# Patient Record
Sex: Female | Born: 1980
Health system: Southern US, Community
[De-identification: ages and names within clinical notes are randomized; demographics above are authoritative.]

## PROBLEM LIST (undated history)

## (undated) DIAGNOSIS — J301 Allergic rhinitis due to pollen: Secondary | ICD-10-CM

## (undated) HISTORY — DX: Allergic rhinitis due to pollen: J30.1

---
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
## 2002-12-13 HISTORY — PX: TUBAL LIGATION: SHX77

## 2004-12-30 ENCOUNTER — Ambulatory Visit (HOSPITAL_COMMUNITY): Admission: RE | Admit: 2004-12-30 | Discharge: 2004-12-30 | Payer: Self-pay | Admitting: Neurosurgery

## 2005-01-17 ENCOUNTER — Encounter: Payer: Self-pay | Admitting: Neurosurgery

## 2005-03-30 ENCOUNTER — Encounter: Payer: Self-pay | Admitting: Neurosurgery

## 2005-08-24 ENCOUNTER — Emergency Department: Payer: Self-pay | Admitting: Unknown Physician Specialty

## 2005-10-24 ENCOUNTER — Emergency Department (HOSPITAL_COMMUNITY): Admission: EM | Admit: 2005-10-24 | Discharge: 2005-10-24 | Payer: Self-pay | Admitting: Emergency Medicine

## 2005-10-30 ENCOUNTER — Emergency Department (HOSPITAL_COMMUNITY): Admission: EM | Admit: 2005-10-30 | Discharge: 2005-10-30 | Payer: Self-pay | Admitting: Emergency Medicine

## 2006-07-16 ENCOUNTER — Emergency Department: Payer: Self-pay | Admitting: Emergency Medicine

## 2008-08-10 ENCOUNTER — Emergency Department: Payer: Self-pay | Admitting: Emergency Medicine

## 2012-06-29 ENCOUNTER — Emergency Department: Payer: Self-pay | Admitting: Internal Medicine

## 2015-01-12 ENCOUNTER — Emergency Department: Payer: Self-pay | Admitting: Emergency Medicine

## 2015-09-27 LAB — HM PAP SMEAR: HM Pap smear: NORMAL

## 2018-03-13 ENCOUNTER — Ambulatory Visit: Payer: BLUE CROSS/BLUE SHIELD | Admitting: Family Medicine

## 2018-03-13 ENCOUNTER — Encounter: Payer: Self-pay | Admitting: Family Medicine

## 2018-03-13 VITALS — BP 108/72 | HR 91 | Temp 97.7°F | Resp 18 | Ht 63.25 in | Wt 160.3 lb

## 2018-03-13 DIAGNOSIS — J3089 Other allergic rhinitis: Secondary | ICD-10-CM | POA: Diagnosis not present

## 2018-03-13 DIAGNOSIS — Z91410 Personal history of adult physical and sexual abuse: Secondary | ICD-10-CM | POA: Insufficient documentation

## 2018-03-13 DIAGNOSIS — R05 Cough: Secondary | ICD-10-CM | POA: Diagnosis not present

## 2018-03-13 DIAGNOSIS — R059 Cough, unspecified: Secondary | ICD-10-CM

## 2018-03-13 HISTORY — DX: Personal history of adult physical and sexual abuse: Z91.410

## 2018-03-13 MED ORDER — FLUTICASONE PROPIONATE 50 MCG/ACT NA SUSP: 2.0000 | Freq: Every day | NASAL | 5 refills | Status: DC

## 2018-03-13 MED ORDER — LORATADINE 10 MG PO TABS: 10.0000 mg | ORAL_TABLET | Freq: Every day | ORAL | 5 refills | Status: DC
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
## 2018-03-14 ENCOUNTER — Encounter: Payer: Self-pay | Admitting: Family Medicine

## 2018-03-16 ENCOUNTER — Encounter: Payer: Self-pay | Admitting: Family Medicine

## 2018-03-18 ENCOUNTER — Encounter: Payer: Self-pay | Admitting: Family Medicine

## 2018-06-06 ENCOUNTER — Ambulatory Visit
Admission: RE | Admit: 2018-06-06 | Discharge: 2018-06-06 | Disposition: A | Discharge status: Home / Self Care | Payer: BLUE CROSS/BLUE SHIELD | Source: Ambulatory Visit | Attending: Family Medicine | Admitting: Family Medicine

## 2018-06-06 ENCOUNTER — Ambulatory Visit: Payer: BLUE CROSS/BLUE SHIELD | Admitting: Family Medicine

## 2018-06-06 ENCOUNTER — Encounter: Payer: Self-pay | Admitting: Family Medicine

## 2018-06-06 VITALS — BP 102/68 | HR 81 | Temp 98.4°F | Resp 18 | Ht 63.0 in | Wt 153.6 lb

## 2018-06-06 DIAGNOSIS — R058 Other specified cough: Secondary | ICD-10-CM

## 2018-06-06 DIAGNOSIS — R05 Cough: Secondary | ICD-10-CM | POA: Insufficient documentation

## 2018-06-06 DIAGNOSIS — Z87891 Personal history of nicotine dependence: Secondary | ICD-10-CM | POA: Diagnosis not present

## 2018-06-06 DIAGNOSIS — J3089 Other allergic rhinitis: Secondary | ICD-10-CM

## 2018-06-06 DIAGNOSIS — Z23 Encounter for immunization: Secondary | ICD-10-CM | POA: Diagnosis not present

## 2018-06-06 DIAGNOSIS — J4520 Mild intermittent asthma, uncomplicated: Secondary | ICD-10-CM

## 2018-06-06 MED ORDER — ALBUTEROL SULFATE HFA 108 (90 BASE) MCG/ACT IN AERS: 2.0000 | INHALATION_SPRAY | Freq: Four times a day (QID) | RESPIRATORY_TRACT | 0 refills | Status: DC | PRN

## 2018-06-06 MED ORDER — BENZONATATE 100 MG PO CAPS: 100.0000 mg | ORAL_CAPSULE | Freq: Two times a day (BID) | ORAL | 0 refills | Status: DC | PRN

## 2018-06-06 MED ORDER — MONTELUKAST SODIUM 10 MG PO TABS: 10.0000 mg | ORAL_TABLET | Freq: Every day | ORAL | 1 refills | Status: DC
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
## 2018-06-27 ENCOUNTER — Ambulatory Visit: Payer: BLUE CROSS/BLUE SHIELD | Admitting: Family Medicine

## 2018-07-14 ENCOUNTER — Encounter: Payer: Self-pay | Admitting: Family Medicine

## 2018-07-14 ENCOUNTER — Ambulatory Visit (INDEPENDENT_AMBULATORY_CARE_PROVIDER_SITE_OTHER): Payer: BLUE CROSS/BLUE SHIELD | Admitting: Family Medicine

## 2018-07-14 ENCOUNTER — Other Ambulatory Visit (HOSPITAL_COMMUNITY)
Admission: RE | Admit: 2018-07-14 | Discharge: 2018-07-14 | Disposition: A | Discharge status: Home / Self Care | Payer: BLUE CROSS/BLUE SHIELD | Source: Ambulatory Visit | Attending: Family Medicine | Admitting: Family Medicine

## 2018-07-14 VITALS — BP 116/68 | HR 79 | Temp 98.3°F | Resp 16 | Ht 63.0 in | Wt 154.1 lb

## 2018-07-14 DIAGNOSIS — Z1151 Encounter for screening for human papillomavirus (HPV): Secondary | ICD-10-CM | POA: Insufficient documentation

## 2018-07-14 DIAGNOSIS — Z01419 Encounter for gynecological examination (general) (routine) without abnormal findings: Secondary | ICD-10-CM | POA: Diagnosis not present

## 2018-07-14 DIAGNOSIS — Z1239 Encounter for other screening for malignant neoplasm of breast: Secondary | ICD-10-CM

## 2018-07-14 DIAGNOSIS — Z131 Encounter for screening for diabetes mellitus: Secondary | ICD-10-CM

## 2018-07-14 DIAGNOSIS — Z124 Encounter for screening for malignant neoplasm of cervix: Secondary | ICD-10-CM | POA: Insufficient documentation

## 2018-07-14 DIAGNOSIS — Z23 Encounter for immunization: Secondary | ICD-10-CM

## 2018-07-14 DIAGNOSIS — Z113 Encounter for screening for infections with a predominantly sexual mode of transmission: Secondary | ICD-10-CM

## 2018-07-14 DIAGNOSIS — R5383 Other fatigue: Secondary | ICD-10-CM

## 2018-07-14 DIAGNOSIS — Z1231 Encounter for screening mammogram for malignant neoplasm of breast: Secondary | ICD-10-CM

## 2018-07-14 NOTE — Patient Instructions (Signed)
Preventive Care 18-39 Years, Female Preventive care refers to lifestyle choices and visits with your health care provider that can promote health and wellness. What does preventive care include?  A yearly physical exam. This is also called an annual well check.  Dental exams once or twice a year.  Routine eye exams. Ask your health care provider how often you should have your eyes checked.  Personal lifestyle choices, including: ? Daily care of your teeth and gums. ? Regular physical activity. ? Eating a healthy diet. ? Avoiding tobacco and drug use. ? Limiting alcohol use. ? Practicing safe sex. ? Taking vitamin and mineral supplements as recommended by your health care provider. What happens during an annual well check? The services and screenings done by your health care provider during your annual well check will depend on your age, overall health, lifestyle risk factors, and family history of disease. Counseling Your health care provider may ask you questions about your:  Alcohol use.  Tobacco use.  Drug use.  Emotional well-being.  Home and relationship well-being.  Sexual activity.  Eating habits.  Work and work Statistician.  Method of birth control.  Menstrual cycle.  Pregnancy history.  Screening You may have the following tests or measurements:  Height, weight, and BMI.  Diabetes screening. This is done by checking your blood sugar (glucose) after you have not eaten for a while (fasting).  Blood pressure.  Lipid and cholesterol levels. These may be checked every 5 years starting at age 66.  Skin check.  Hepatitis C blood test.  Hepatitis B blood test.  Sexually transmitted disease (STD) testing.  BRCA-related cancer screening. This may be done if you have a family history of breast, ovarian, tubal, or peritoneal cancers.  Pelvic exam and Pap test. This may be done every 3 years starting at age 40. Starting at age 59, this may be done every 5  years if you have a Pap test in combination with an HPV test.  Discuss your test results, treatment options, and if necessary, the need for more tests with your health care provider. Vaccines Your health care provider may recommend certain vaccines, such as:  Influenza vaccine. This is recommended every year.  Tetanus, diphtheria, and acellular pertussis (Tdap, Td) vaccine. You may need a Td booster every 10 years.  Varicella vaccine. You may need this if you have not been vaccinated.  HPV vaccine. If you are 69 or younger, you may need three doses over 6 months.  Measles, mumps, and rubella (MMR) vaccine. You may need at least one dose of MMR. You may also need a second dose.  Pneumococcal 13-valent conjugate (PCV13) vaccine. You may need this if you have certain conditions and were not previously vaccinated.  Pneumococcal polysaccharide (PPSV23) vaccine. You may need one or two doses if you smoke cigarettes or if you have certain conditions.  Meningococcal vaccine. One dose is recommended if you are age 27-21 years and a first-year college student living in a residence hall, or if you have one of several medical conditions. You may also need additional booster doses.  Hepatitis A vaccine. You may need this if you have certain conditions or if you travel or work in places where you may be exposed to hepatitis A.  Hepatitis B vaccine. You may need this if you have certain conditions or if you travel or work in places where you may be exposed to hepatitis B.  Haemophilus influenzae type b (Hib) vaccine. You may need this if  you have certain risk factors.  Talk to your health care provider about which screenings and vaccines you need and how often you need them. This information is not intended to replace advice given to you by your health care provider. Make sure you discuss any questions you have with your health care provider. Document Released: 01/01/2002 Document Revised: 07/25/2016  Document Reviewed: 09/06/2015 Elsevier Interactive Patient Education  [employer].
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
## 2018-07-15 LAB — CBC WITH DIFFERENTIAL/PLATELET
Basophils Absolute: 22 {cells}/uL (ref 0–200)
Basophils Relative: 0.5 %
Eosinophils Absolute: 22 {cells}/uL (ref 15–500)
Eosinophils Relative: 0.5 %
HCT: 36.4 % (ref 35.0–45.0)
Hemoglobin: 12.5 g/dL (ref 11.7–15.5)
Lymphs Abs: 1402 {cells}/uL (ref 850–3900)
MCH: 31.4 pg (ref 27.0–33.0)
MCHC: 34.3 g/dL (ref 32.0–36.0)
MCV: 91.5 fL (ref 80.0–100.0)
MPV: 11 fL (ref 7.5–12.5)
Monocytes Relative: 7 %
Neutro Abs: 2554 {cells}/uL (ref 1500–7800)
Neutrophils Relative %: 59.4 %
Platelets: 199 Thousand/uL (ref 140–400)
RBC: 3.98 Million/uL (ref 3.80–5.10)
RDW: 11.9 % (ref 11.0–15.0)
Total Lymphocyte: 32.6 %
WBC mixed population: 301 {cells}/uL (ref 200–950)
WBC: 4.3 Thousand/uL (ref 3.8–10.8)

## 2018-07-15 LAB — COMPLETE METABOLIC PANEL WITHOUT GFR
AG Ratio: 1.7 (calc) (ref 1.0–2.5)
ALT: 8 U/L (ref 6–29)
AST: 13 U/L (ref 10–30)
Albumin: 4.3 g/dL (ref 3.6–5.1)
Alkaline phosphatase (APISO): 45 U/L (ref 33–115)
BUN: 17 mg/dL (ref 7–25)
CO2: 27 mmol/L (ref 20–32)
Calcium: 8.9 mg/dL (ref 8.6–10.2)
Chloride: 107 mmol/L (ref 98–110)
Creat: 0.97 mg/dL (ref 0.50–1.10)
GFR, Est African American: 85 mL/min/1.73m2
GFR, Est Non African American: 74 mL/min/1.73m2
Globulin: 2.6 g/dL (ref 1.9–3.7)
Glucose, Bld: 88 mg/dL (ref 65–99)
Potassium: 3.8 mmol/L (ref 3.5–5.3)
Sodium: 140 mmol/L (ref 135–146)
Total Bilirubin: 0.3 mg/dL (ref 0.2–1.2)
Total Protein: 6.9 g/dL (ref 6.1–8.1)

## 2018-07-15 LAB — LIPID PANEL
Cholesterol: 161 mg/dL
HDL: 77 mg/dL
LDL Cholesterol (Calc): 71 mg/dL
Non-HDL Cholesterol (Calc): 84 mg/dL
Total CHOL/HDL Ratio: 2.1 (calc)
Triglycerides: 44 mg/dL

## 2018-07-15 LAB — VITAMIN B12: Vitamin B-12: 372 pg/mL (ref 200–1100)

## 2018-07-15 LAB — HEPATITIS PANEL, ACUTE
HEP B C IGM: NONREACTIVE
HEP C AB: NONREACTIVE
Hep A IgM: NONREACTIVE
Hepatitis B Surface Ag: NONREACTIVE
SIGNAL TO CUT-OFF: 0.02 (ref <1.00)

## 2018-07-15 LAB — HEMOGLOBIN A1C
EAG (MMOL/L): 5.5 (calc)
Hgb A1c MFr Bld: 5.1 % of total Hgb (ref <5.7)
Mean Plasma Glucose: 100 (calc)

## 2018-07-15 LAB — HIV ANTIBODY (ROUTINE TESTING W REFLEX): HIV 1&2 Ab, 4th Generation: NONREACTIVE

## 2018-07-15 LAB — RPR: RPR Ser Ql: NONREACTIVE

## 2018-07-15 LAB — TSH: TSH: 1.04 m[IU]/L

## 2018-07-15 LAB — VITAMIN D 25 HYDROXY (VIT D DEFICIENCY, FRACTURES): Vit D, 25-Hydroxy: 23 ng/mL — ABNORMAL LOW (ref 30–100)

## 2018-07-16 LAB — CYTOLOGY - PAP
Chlamydia: NEGATIVE
DIAGNOSIS: NEGATIVE
HPV 16/18/45 GENOTYPING: NEGATIVE
HPV: DETECTED — AB
Neisseria Gonorrhea: NEGATIVE

## 2018-07-18 ENCOUNTER — Encounter: Payer: Self-pay | Admitting: Family Medicine

## 2018-07-18 DIAGNOSIS — R87810 Cervical high risk human papillomavirus (HPV) DNA test positive: Secondary | ICD-10-CM | POA: Insufficient documentation

## 2018-07-18 HISTORY — DX: Cervical high risk human papillomavirus (HPV) DNA test positive: R87.810

## 2018-09-12 ENCOUNTER — Ambulatory Visit: Payer: BLUE CROSS/BLUE SHIELD

## 2019-01-06 ENCOUNTER — Other Ambulatory Visit: Payer: Self-pay | Admitting: Family Medicine

## 2019-01-06 DIAGNOSIS — Z1231 Encounter for screening mammogram for malignant neoplasm of breast: Secondary | ICD-10-CM

## 2019-01-20 ENCOUNTER — Ambulatory Visit
Admission: RE | Admit: 2019-01-20 | Discharge: 2019-01-20 | Disposition: A | Discharge status: Home / Self Care | Payer: BLUE CROSS/BLUE SHIELD | Source: Ambulatory Visit | Attending: Family Medicine | Admitting: Family Medicine

## 2019-01-20 DIAGNOSIS — Z1231 Encounter for screening mammogram for malignant neoplasm of breast: Secondary | ICD-10-CM | POA: Diagnosis not present

## 2019-04-21 ENCOUNTER — Other Ambulatory Visit: Payer: Self-pay | Admitting: Family Medicine

## 2019-04-21 DIAGNOSIS — J3089 Other allergic rhinitis: Secondary | ICD-10-CM

## 2019-04-21 MED ORDER — LORATADINE 10 MG PO TABS: 10.0000 mg | ORAL_TABLET | Freq: Every day | ORAL | 2 refills | Status: DC

## 2019-04-21 NOTE — Telephone Encounter (Signed)
Copied from CRM [id number]. Topic: Quick Communication - Rx Refill/Question >> Apr 21, 2019  2:38 PM Gwenlyn Fudge A wrote: Medication: loratadine (CLARITIN) 10 MG tablet  Has the patient contacted their pharmacy? No. (Agent: If no, request that the patient contact the pharmacy for the refill.) (Agent: If yes, when and what did the pharmacy advise?)  Preferred Pharmacy (with phone number or street name): [facility] [address] [place] - [address] [place] [address] Phone: [phone] Fax: [phone] Not a 24 hour pharmacy; exact hours not known.    Agent: Please be advised that RX refills may take up to 3 business days. We ask that you follow-up with your pharmacy.

## 2019-07-09 ENCOUNTER — Other Ambulatory Visit: Payer: Self-pay

## 2019-07-09 ENCOUNTER — Encounter: Payer: Self-pay | Admitting: Nurse Practitioner

## 2019-07-09 ENCOUNTER — Ambulatory Visit (INDEPENDENT_AMBULATORY_CARE_PROVIDER_SITE_OTHER): Payer: BLUE CROSS/BLUE SHIELD | Admitting: Nurse Practitioner

## 2019-07-09 VITALS — Resp 16

## 2019-07-09 DIAGNOSIS — L02211 Cutaneous abscess of abdominal wall: Secondary | ICD-10-CM | POA: Diagnosis not present

## 2019-07-09 MED ORDER — SULFAMETHOXAZOLE-TRIMETHOPRIM 800-160 MG PO TABS: 1.0000 | ORAL_TABLET | Freq: Two times a day (BID) | ORAL | 0 refills | Status: DC
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
## 2019-07-09 NOTE — Patient Instructions (Addendum)
Please take your antibiotic as prescribed with food on your stomach to prevent nausea and upset stomach. Take the antibiotic for the entire course, even if your symptoms resolve before the course if completed. Using antibiotics inappropriately can make it harder to treat future infections. If you have any concerning side effects please stop the medication and let us know immediately. I do recommend taking a probiotic to replenish your good gut health anytime you take an antibiotic. You can get probiotics in types of yogurt like activia, or other food/drinks such as kimchi, kombucha , sauerkraut, or you can take an over the counter supplement.   Skin Abscess A skin abscess is an infected area on or under your skin that contains a collection of pus and other material. An abscess may also be called a furuncle, carbuncle, or boil. An abscess can occur in or on almost any part of your body. Some abscesses break open (rupture) on their own. Most continue to get worse unless they are treated. The infection can spread deeper into the body and eventually into your blood, which can make you feel ill. Treatment usually involves draining the abscess. What are the causes? An abscess occurs when germs, like bacteria, pass through your skin and cause an infection. This may be caused by:  A scrape or cut on your skin.  A puncture wound through your skin, including a needle injection or insect bite.  Blocked oil or sweat glands.  Blocked and infected hair follicles.  A cyst that forms beneath your skin (sebaceous cyst) and becomes infected. What increases the risk? This condition is more likely to develop in people who:  Have a weak body defense system (immune system).  Have diabetes.  Have dry and irritated skin.  Get frequent injections or use illegal IV drugs.  Have a foreign body in a wound, such as a splinter.  Have problems with their lymph system or veins. What are the signs or symptoms?  Symptoms of this condition include:  A painful, firm bump under the skin.  A bump with pus at the top. This may break through the skin and drain. Other symptoms include:  Redness surrounding the abscess site.  Warmth.  Swelling of the lymph nodes (glands) near the abscess.  Tenderness.  A sore on the skin. How is this diagnosed? This condition may be diagnosed based on:  A physical exam.  Your medical history.  A sample of pus. This may be used to find out what is causing the infection.  Blood tests.  Imaging tests, such as an ultrasound, CT scan, or MRI. How is this treated? A small abscess that drains on its own may not need treatment. Treatment for larger abscesses may include:  Moist heat or heat pack applied to the area several times a day.  A procedure to drain the abscess (incision and drainage).  Antibiotic medicines. For a severe abscess, you may first get antibiotics through an IV and then change to antibiotics by mouth. Follow these instructions at home: Medicines   Take over-the-counter and prescription medicines only as told by your health care provider.  If you were prescribed an antibiotic medicine, take it as told by your health care provider. Do not stop taking the antibiotic even if you start to feel better. Abscess care   If you have an abscess that has not drained, apply heat to the affected area. Use the heat source that your health care provider recommends, such as a moist heat pack or  a heating pad. ? Place a towel between your skin and the heat source. ? Leave the heat on for 20-30 minutes. ? Remove the heat if your skin turns bright red. This is especially important if you are unable to feel pain, heat, or cold. You may have a greater risk of getting burned.  Follow instructions from your health care provider about how to take care of your abscess. Make sure you: ? Cover the abscess with a bandage (dressing). ? Change your dressing or  gauze as told by your health care provider. ? Wash your hands with soap and water before you change the dressing or gauze. If soap and water are not available, use hand sanitizer.  Check your abscess every day for signs of a worsening infection. Check for: ? More redness, swelling, or pain. ? More fluid or blood. ? Warmth. ? More pus or a bad smell. General instructions  To avoid spreading the infection: ? Do not share personal care items, towels, or hot tubs with others. ? Avoid making skin contact with other people.  Keep all follow-up visits as told by your health care provider. This is important. Contact a health care provider if you have:  More redness, swelling, or pain around your abscess.  More fluid or blood coming from your abscess.  Warm skin around your abscess.  More pus or a bad smell coming from your abscess.  A fever.  Muscle aches.  Chills or a general ill feeling. Get help right away if you:  Have severe pain.  See red streaks on your skin spreading away from the abscess. Summary  A skin abscess is an infected area on or under your skin that contains a collection of pus and other material.  A small abscess that drains on its own may not need treatment.  Treatment for larger abscesses may include having a procedure to drain the abscess and taking an antibiotic. This information is not intended to replace advice given to you by your health care provider. Make sure you discuss any questions you have with your health care provider. Document Released: 08/15/2005 Document Revised: 02/26/2019 Document Reviewed: 12/19/2017 Elsevier Patient Education  2020 [employer].

## 2019-07-16 ENCOUNTER — Ambulatory Visit (INDEPENDENT_AMBULATORY_CARE_PROVIDER_SITE_OTHER): Payer: BLUE CROSS/BLUE SHIELD | Admitting: Family Medicine

## 2019-07-16 ENCOUNTER — Encounter: Payer: Self-pay | Admitting: Family Medicine

## 2019-07-16 ENCOUNTER — Other Ambulatory Visit: Payer: Self-pay

## 2019-07-16 ENCOUNTER — Other Ambulatory Visit (HOSPITAL_COMMUNITY)
Admission: RE | Admit: 2019-07-16 | Discharge: 2019-07-16 | Disposition: A | Discharge status: Home / Self Care | Payer: BLUE CROSS/BLUE SHIELD | Source: Ambulatory Visit | Attending: Family Medicine | Admitting: Family Medicine

## 2019-07-16 VITALS — BP 110/72 | HR 108 | Temp 96.6°F | Resp 16 | Ht 62.75 in | Wt 158.2 lb

## 2019-07-16 DIAGNOSIS — Z1239 Encounter for other screening for malignant neoplasm of breast: Secondary | ICD-10-CM | POA: Diagnosis not present

## 2019-07-16 DIAGNOSIS — Z124 Encounter for screening for malignant neoplasm of cervix: Secondary | ICD-10-CM

## 2019-07-16 DIAGNOSIS — Z01419 Encounter for gynecological examination (general) (routine) without abnormal findings: Secondary | ICD-10-CM | POA: Diagnosis not present

## 2019-07-16 NOTE — Patient Instructions (Signed)
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
## 2019-07-21 LAB — CYTOLOGY - PAP: Diagnosis: NEGATIVE

## 2020-01-22 ENCOUNTER — Ambulatory Visit
Admission: RE | Admit: 2020-01-22 | Discharge: 2020-01-22 | Disposition: A | Discharge status: Home / Self Care | Payer: BLUE CROSS/BLUE SHIELD | Source: Ambulatory Visit | Attending: Family Medicine | Admitting: Family Medicine

## 2020-01-22 DIAGNOSIS — Z1231 Encounter for screening mammogram for malignant neoplasm of breast: Secondary | ICD-10-CM | POA: Insufficient documentation

## 2020-01-22 DIAGNOSIS — Z1239 Encounter for other screening for malignant neoplasm of breast: Secondary | ICD-10-CM

## 2020-04-13 ENCOUNTER — Telehealth: Payer: Self-pay

## 2020-04-13 NOTE — Telephone Encounter (Signed)
Copied from CRM [id number]. Topic: General - Other >> Apr 13, 2020  9:00 AM Worthy Keeler D wrote: Reason for CRM: Patient ci requesting a copy of her tb test results  I don't see the results or an order in the system. Lab is checking on this as well.

## 2020-05-29 ENCOUNTER — Other Ambulatory Visit: Payer: Self-pay | Admitting: Family Medicine

## 2020-05-29 DIAGNOSIS — J3089 Other allergic rhinitis: Secondary | ICD-10-CM

## 2020-05-29 NOTE — Telephone Encounter (Signed)
Requested Prescriptions  Pending Prescriptions Disp Refills  . loratadine (CLARITIN) 10 MG tablet [Pharmacy Med Name: LORATADINE 10 MG TABLET] 90 tablet     Sig: TAKE 1 TABLET BY MOUTH EVERY DAY     Ear, Nose, and Throat:  Antihistamines Passed - 05/29/2020 12:49 AM      Passed - Valid encounter within last 12 months    Recent Outpatient Visits          10 months ago Well woman exam   [facility] Alba Cory, MD   10 months ago Abscess of abdominal wall   [facility] Cheryle Horsfall, NP   1 year ago Well woman exam   [facility] Alba Cory, MD   1 year ago Recurrent cough   [facility] Alba Cory, MD   2 years ago Environmental and seasonal allergies   [facility] Alba Cory, MD      Future Appointments            In 2 months Carlynn Purl, Danna Hefty, MD [facility]
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
## 2020-08-05 ENCOUNTER — Inpatient Hospital Stay (HOSPITAL_COMMUNITY): Admit: 2020-08-05 | Payer: BLUE CROSS/BLUE SHIELD

## 2020-08-05 ENCOUNTER — Encounter: Payer: Self-pay | Admitting: Family Medicine

## 2020-08-05 ENCOUNTER — Ambulatory Visit (INDEPENDENT_AMBULATORY_CARE_PROVIDER_SITE_OTHER): Payer: BLUE CROSS/BLUE SHIELD | Admitting: Family Medicine

## 2020-08-05 ENCOUNTER — Other Ambulatory Visit: Payer: Self-pay

## 2020-08-05 VITALS — BP 100/70 | HR 79 | Temp 98.1°F | Resp 16 | Ht 63.0 in | Wt 164.3 lb

## 2020-08-05 DIAGNOSIS — Z124 Encounter for screening for malignant neoplasm of cervix: Secondary | ICD-10-CM

## 2020-08-05 DIAGNOSIS — Z0000 Encounter for general adult medical examination without abnormal findings: Secondary | ICD-10-CM | POA: Diagnosis not present

## 2020-08-05 NOTE — Patient Instructions (Addendum)
Check coverage of HPV vaccine Please re-consider flu and COVID vaccine   Preventive Care 41-41 Years Old, Female Preventive care refers to visits with your health care provider and lifestyle choices that can promote health and wellness. This includes:  A yearly physical exam. This may also be called an annual well check.  Regular dental visits and eye exams.  Immunizations.  Screening for certain conditions.  Healthy lifestyle choices, such as eating a healthy diet, getting regular exercise, not using drugs or products that contain nicotine and tobacco, and limiting alcohol use. What can I expect for my preventive care visit? Physical exam Your health care provider will check your:  Height and weight. This may be used to calculate body mass index (BMI), which tells if you are at a healthy weight.  Heart rate and blood pressure.  Skin for abnormal spots. Counseling Your health care provider may ask you questions about your:  Alcohol, tobacco, and drug use.  Emotional well-being.  Home and relationship well-being.  Sexual activity.  Eating habits.  Work and work Statistician.  Method of birth control.  Menstrual cycle.  Pregnancy history. What immunizations do I need?  Influenza (flu) vaccine  This is recommended every year. Tetanus, diphtheria, and pertussis (Tdap) vaccine  You may need a Td booster every 10 years. Varicella (chickenpox) vaccine  You may need this if you have not been vaccinated. Zoster (shingles) vaccine  You may need this after age 55. Measles, mumps, and rubella (MMR) vaccine  You may need at least one dose of MMR if you were born in 1957 or later. You may also need a second dose. Pneumococcal conjugate (PCV13) vaccine  You may need this if you have certain conditions and were not previously vaccinated. Pneumococcal polysaccharide (PPSV23) vaccine  You may need one or two doses if you smoke cigarettes or if you have certain  conditions. Meningococcal conjugate (MenACWY) vaccine  You may need this if you have certain conditions. Hepatitis A vaccine  You may need this if you have certain conditions or if you travel or work in places where you may be exposed to hepatitis A. Hepatitis B vaccine  You may need this if you have certain conditions or if you travel or work in places where you may be exposed to hepatitis B. Haemophilus influenzae type b (Hib) vaccine  You may need this if you have certain conditions. Human papillomavirus (HPV) vaccine  If recommended by your health care provider, you may need three doses over 6 months. You may receive vaccines as individual doses or as more than one vaccine together in one shot (combination vaccines). Talk with your health care provider about the risks and benefits of combination vaccines. What tests do I need? Blood tests  Lipid and cholesterol levels. These may be checked every 5 years, or more frequently if you are over 10 years old.  Hepatitis C test.  Hepatitis B test. Screening  Lung cancer screening. You may have this screening every year starting at age 46 if you have a 30-pack-year history of smoking and currently smoke or have quit within the past 15 years.  Colorectal cancer screening. All adults should have this screening starting at age 82 and continuing until age 73. Your health care provider may recommend screening at age 35 if you are at increased risk. You will have tests every 1-10 years, depending on your results and the type of screening test.  Diabetes screening. This is done by checking your blood sugar (glucose)  after you have not eaten for a while (fasting). You may have this done every 1-3 years.  Mammogram. This may be done every 1-2 years. Talk with your health care provider about when you should start having regular mammograms. This may depend on whether you have a family history of breast cancer.  BRCA-related cancer screening. This  may be done if you have a family history of breast, ovarian, tubal, or peritoneal cancers.  Pelvic exam and Pap test. This may be done every 3 years starting at age 26. Starting at age 30, this may be done every 5 years if you have a Pap test in combination with an HPV test. Other tests  Sexually transmitted disease (STD) testing.  Bone density scan. This is done to screen for osteoporosis. You may have this scan if you are at high risk for osteoporosis. Follow these instructions at home: Eating and drinking  Eat a diet that includes fresh fruits and vegetables, whole grains, lean protein, and low-fat dairy.  Take vitamin and mineral supplements as recommended by your health care provider.  Do not drink alcohol if: ? Your health care provider tells you not to drink. ? You are pregnant, may be pregnant, or are planning to become pregnant.  If you drink alcohol: ? Limit how much you have to 0-1 drink a day. ? Be aware of how much alcohol is in your drink. In the [place] one drink equals one 12 oz bottle of beer (355 mL), one 5 oz glass of wine (148 mL), or one 1 oz glass of hard liquor (44 mL). Lifestyle  Take daily care of your teeth and gums.  Stay active. Exercise for at least 30 minutes on 5 or more days each week.  Do not use any products that contain nicotine or tobacco, such as cigarettes, e-cigarettes, and chewing tobacco. If you need help quitting, ask your health care provider.  If you are sexually active, practice safe sex. Use a condom or other form of birth control (contraception) in order to prevent pregnancy and STIs (sexually transmitted infections).  If told by your health care provider, take low-dose aspirin daily starting at age 35. What's next?  Visit your health care provider once a year for a well check visit.  Ask your health care provider how often you should have your eyes and teeth checked.  Stay up to date on all vaccines. This information is not  intended to replace advice given to you by your health care provider. Make sure you discuss any questions you have with your health care provider. Document Revised: 07/17/2018 Document Reviewed: 07/17/2018 Elsevier Patient Education  2020 [employer].

## 2020-08-06 LAB — COMPLETE METABOLIC PANEL WITH GFR
AG Ratio: 1.4 (calc) (ref 1.0–2.5)
ALT: 11 U/L (ref 6–29)
AST: 14 U/L (ref 10–30)
Albumin: 4.1 g/dL (ref 3.6–5.1)
Alkaline phosphatase (APISO): 53 U/L (ref 31–125)
BUN: 15 mg/dL (ref 7–25)
CO2: 23 mmol/L (ref 20–32)
Calcium: 8.7 mg/dL (ref 8.6–10.2)
Chloride: 107 mmol/L (ref 98–110)
Creat: 0.98 mg/dL (ref 0.50–1.10)
GFR, Est African American: 83 mL/min/{1.73_m2} (ref >=60)
GFR, Est Non African American: 72 mL/min/{1.73_m2} (ref >=60)
Globulin: 3 g/dL (calc) (ref 1.9–3.7)
Glucose, Bld: 85 mg/dL (ref 65–99)
Potassium: 4.3 mmol/L (ref 3.5–5.3)
Sodium: 140 mmol/L (ref 135–146)
Total Bilirubin: 0.5 mg/dL (ref 0.2–1.2)
Total Protein: 7.1 g/dL (ref 6.1–8.1)

## 2020-08-06 LAB — CBC WITH DIFFERENTIAL/PLATELET
Absolute Monocytes: 475 cells/uL (ref 200–950)
Basophils Absolute: 31 cells/uL (ref 0–200)
Basophils Relative: 0.7 %
Eosinophils Absolute: 79 cells/uL (ref 15–500)
Eosinophils Relative: 1.8 %
HCT: 39.7 % (ref 35.0–45.0)
Hemoglobin: 13.3 g/dL (ref 11.7–15.5)
Lymphs Abs: 1478 cells/uL (ref 850–3900)
MCH: 31.1 pg (ref 27.0–33.0)
MCHC: 33.5 g/dL (ref 32.0–36.0)
MCV: 92.8 fL (ref 80.0–100.0)
MPV: 10.8 fL (ref 7.5–12.5)
Monocytes Relative: 10.8 %
Neutro Abs: 2336 cells/uL (ref 1500–7800)
Neutrophils Relative %: 53.1 %
Platelets: 214 10*3/uL (ref 140–400)
RBC: 4.28 10*6/uL (ref 3.80–5.10)
RDW: 11.8 % (ref 11.0–15.0)
Total Lymphocyte: 33.6 %
WBC: 4.4 10*3/uL (ref 3.8–10.8)

## 2020-08-06 LAB — ADVANCED WRITTEN NOTIFICATION (AWN) TEST REFUSAL: AWN TEST REFUSED: 760016802

## 2020-08-08 LAB — CYTOLOGY - PAP
Chlamydia: NEGATIVE
Comment: NEGATIVE
Comment: NEGATIVE
Comment: NORMAL
Diagnosis: NEGATIVE
High risk HPV: NEGATIVE
Neisseria Gonorrhea: NEGATIVE

## 2020-12-16 ENCOUNTER — Other Ambulatory Visit: Payer: Self-pay | Admitting: Family Medicine

## 2020-12-16 DIAGNOSIS — Z1231 Encounter for screening mammogram for malignant neoplasm of breast: Secondary | ICD-10-CM

## 2021-01-27 ENCOUNTER — Other Ambulatory Visit: Payer: Self-pay

## 2021-01-27 ENCOUNTER — Ambulatory Visit
Admission: RE | Admit: 2021-01-27 | Discharge: 2021-01-27 | Disposition: A | Discharge status: Home / Self Care | Payer: BLUE CROSS/BLUE SHIELD | Source: Ambulatory Visit | Attending: Family Medicine | Admitting: Family Medicine

## 2021-01-27 DIAGNOSIS — Z1231 Encounter for screening mammogram for malignant neoplasm of breast: Secondary | ICD-10-CM | POA: Insufficient documentation

## 2021-08-11 ENCOUNTER — Encounter: Payer: BLUE CROSS/BLUE SHIELD | Admitting: Family Medicine

## 2021-08-11 ENCOUNTER — Other Ambulatory Visit: Payer: Self-pay

## 2021-08-11 NOTE — Patient Instructions (Signed)
Preventive Care 40-42 Years Old, Female Preventive care refers to lifestyle choices and visits with your health care provider that can promote health and wellness. This includes: A yearly physical exam. This is also called an annual wellness visit. Regular dental and eye exams. Immunizations. Screening for certain conditions. Healthy lifestyle choices, such as: Eating a healthy diet. Getting regular exercise. Not using drugs or products that contain nicotine and tobacco. Limiting alcohol use. What can I expect for my preventive care visit? Physical exam Your health care provider will check your: Height and weight. These may be used to calculate your BMI (body mass index). BMI is a measurement that tells if you are at a healthy weight. Heart rate and blood pressure. Body temperature. Skin for abnormal spots. Counseling Your health care provider may ask you questions about your: Past medical problems. Family's medical history. Alcohol, tobacco, and drug use. Emotional well-being. Home life and relationship well-being. Sexual activity. Diet, exercise, and sleep habits. Work and work environment. Access to firearms. Method of birth control. Menstrual cycle. Pregnancy history. What immunizations do I need? Vaccines are usually given at various ages, according to a schedule. Your health care provider will recommend vaccines for you based on your age, medical history, and lifestyle or other factors, such as travel or where you work. What tests do I need? Blood tests Lipid and cholesterol levels. These may be checked every 5 years, or more often if you are over 50 years old. Hepatitis C test. Hepatitis B test. Screening Lung cancer screening. You may have this screening every year starting at age 55 if you have a 30-pack-year history of smoking and currently smoke or have quit within the past 15 years. Colorectal cancer screening. All adults should have this screening starting at  age 50 and continuing until age 75. Your health care provider may recommend screening at age 45 if you are at increased risk. You will have tests every 1-10 years, depending on your results and the type of screening test. Diabetes screening. This is done by checking your blood sugar (glucose) after you have not eaten for a while (fasting). You may have this done every 1-3 years. Mammogram. This may be done every 1-2 years. Talk with your health care provider about when you should start having regular mammograms. This may depend on whether you have a family history of breast cancer. BRCA-related cancer screening. This may be done if you have a family history of breast, ovarian, tubal, or peritoneal cancers. Pelvic exam and Pap test. This may be done every 3 years starting at age 21. Starting at age 30, this may be done every 5 years if you have a Pap test in combination with an HPV test. Other tests STD (sexually transmitted disease) testing, if you are at risk. Bone density scan. This is done to screen for osteoporosis. You may have this scan if you are at high risk for osteoporosis. Talk with your health care provider about your test results, treatment options, and if necessary, the need for more tests. Follow these instructions at home: Eating and drinking  Eat a diet that includes fresh fruits and vegetables, whole grains, lean protein, and low-fat dairy products. Take vitamin and mineral supplements as recommended by your health care provider. Do not drink alcohol if: Your health care provider tells you not to drink. You are pregnant, may be pregnant, or are planning to become pregnant. If you drink alcohol: Limit how much you have to 0-1 drink a day. Be   aware of how much alcohol is in your drink. In the [place] one drink equals one 12 oz bottle of beer (355 mL), one 5 oz glass of wine (148 mL), or one 1 oz glass of hard liquor (44 mL). Lifestyle Take daily care of your teeth and  gums. Brush your teeth every morning and night with fluoride toothpaste. Floss one time each day. Stay active. Exercise for at least 30 minutes 5 or more days each week. Do not use any products that contain nicotine or tobacco, such as cigarettes, e-cigarettes, and chewing tobacco. If you need help quitting, ask your health care provider. Do not use drugs. If you are sexually active, practice safe sex. Use a condom or other form of protection to prevent STIs (sexually transmitted infections). If you do not wish to become pregnant, use a form of birth control. If you plan to become pregnant, see your health care provider for a prepregnancy visit. If told by your health care provider, take low-dose aspirin daily starting at age 63. Find healthy ways to cope with stress, such as: Meditation, yoga, or listening to music. Journaling. Talking to a trusted person. Spending time with friends and family. Safety Always wear your seat belt while driving or riding in a vehicle. Do not drive: If you have been drinking alcohol. Do not ride with someone who has been drinking. When you are tired or distracted. While texting. Wear a helmet and other protective equipment during sports activities. If you have firearms in your house, make sure you follow all gun safety procedures. What's next? Visit your health care provider once a year for an annual wellness visit. Ask your health care provider how often you should have your eyes and teeth checked. Stay up to date on all vaccines. This information is not intended to replace advice given to you by your health care provider. Make sure you discuss any questions you have with your health care provider. Document Revised: 01/13/2021 Document Reviewed: 07/17/2018 Elsevier Patient Education  2022 [employer].

## 2021-08-11 NOTE — Progress Notes (Signed)
Not seen, insurance out of network

## 2022-02-13 ENCOUNTER — Other Ambulatory Visit (HOSPITAL_COMMUNITY)
Admission: RE | Admit: 2022-02-13 | Discharge: 2022-02-13 | Disposition: A | Discharge status: Home / Self Care | Payer: 59 | Source: Ambulatory Visit | Attending: Obstetrics & Gynecology | Admitting: Obstetrics & Gynecology

## 2022-02-13 ENCOUNTER — Other Ambulatory Visit: Payer: Self-pay

## 2022-02-13 ENCOUNTER — Ambulatory Visit (INDEPENDENT_AMBULATORY_CARE_PROVIDER_SITE_OTHER): Payer: 59 | Admitting: Obstetrics & Gynecology

## 2022-02-13 ENCOUNTER — Encounter: Payer: Self-pay | Admitting: Obstetrics & Gynecology

## 2022-02-13 VITALS — BP 121/84 | HR 83 | Ht 62.0 in | Wt 187.8 lb

## 2022-02-13 DIAGNOSIS — Z1231 Encounter for screening mammogram for malignant neoplasm of breast: Secondary | ICD-10-CM | POA: Diagnosis not present

## 2022-02-13 DIAGNOSIS — Z01419 Encounter for gynecological examination (general) (routine) without abnormal findings: Secondary | ICD-10-CM | POA: Insufficient documentation
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
## 2022-02-16 LAB — CYTOLOGY - PAP
Comment: NEGATIVE
Diagnosis: UNDETERMINED — AB
High risk HPV: NEGATIVE

## 2022-02-19 ENCOUNTER — Encounter: Payer: Self-pay | Admitting: Obstetrics & Gynecology

## 2022-02-19 DIAGNOSIS — R87610 Atypical squamous cells of undetermined significance on cytologic smear of cervix (ASC-US): Secondary | ICD-10-CM | POA: Insufficient documentation

## 2022-03-23 ENCOUNTER — Ambulatory Visit
Admission: RE | Admit: 2022-03-23 | Discharge: 2022-03-23 | Disposition: A | Discharge status: Home / Self Care | Payer: 59 | Source: Ambulatory Visit | Attending: Obstetrics & Gynecology | Admitting: Obstetrics & Gynecology

## 2022-03-23 DIAGNOSIS — Z1231 Encounter for screening mammogram for malignant neoplasm of breast: Secondary | ICD-10-CM | POA: Diagnosis not present

## 2022-05-07 ENCOUNTER — Encounter: Payer: 59 | Admitting: Family Medicine

## 2022-07-17 ENCOUNTER — Ambulatory Visit: Payer: 59 | Admitting: Family Medicine

## 2022-12-11 IMAGING — MG MM DIGITAL SCREENING BILAT W/ TOMO AND CAD
8 series · 8 of 24 positions shown · non-contrast
Comparison: Previous exam(s).

CLINICAL DATA: Screening.

EXAM:
DIGITAL SCREENING BILATERAL MAMMOGRAM WITH TOMOSYNTHESIS AND CAD
TECHNIQUE: Bilateral screening digital craniocaudal and mediolateral oblique
mammograms were obtained. Bilateral screening digital breast
tomosynthesis was performed. The images were evaluated with
computer-aided detection.

[R CC synth-2D]
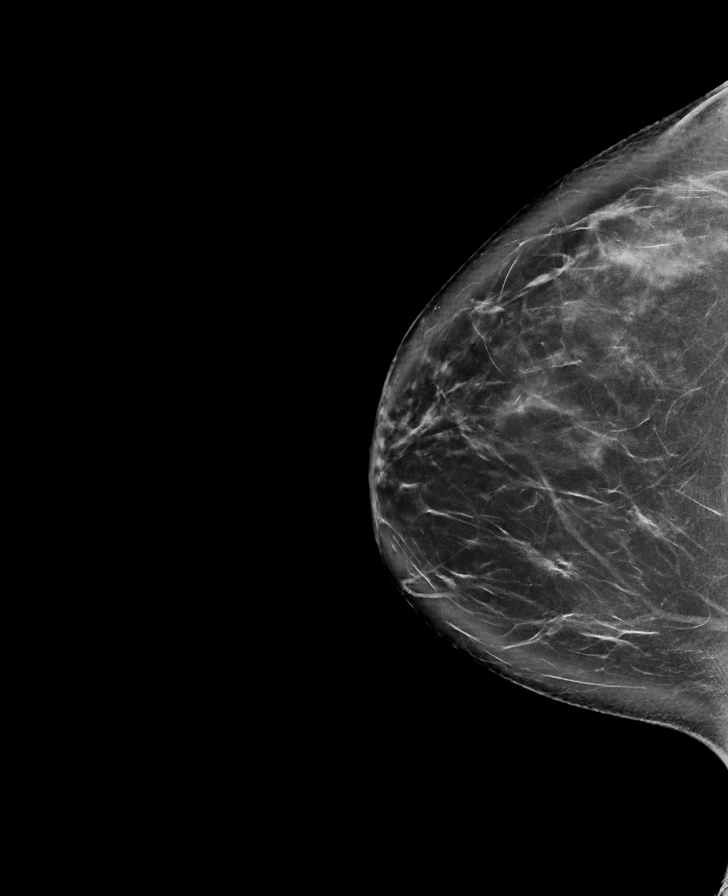

[L CC synth-2D]
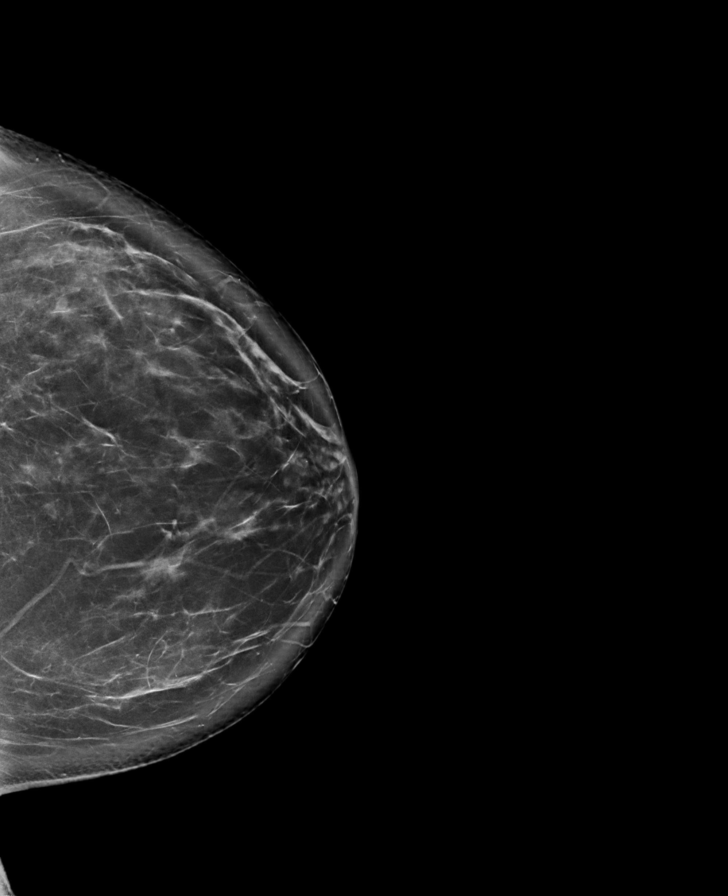

[R MLO synth-2D]
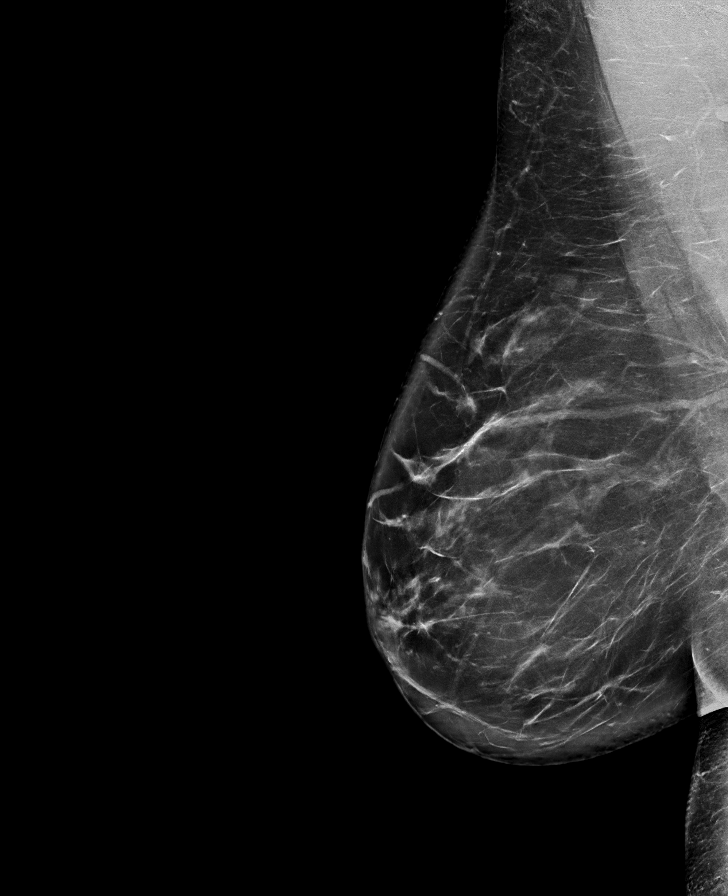

[L MLO synth-2D]
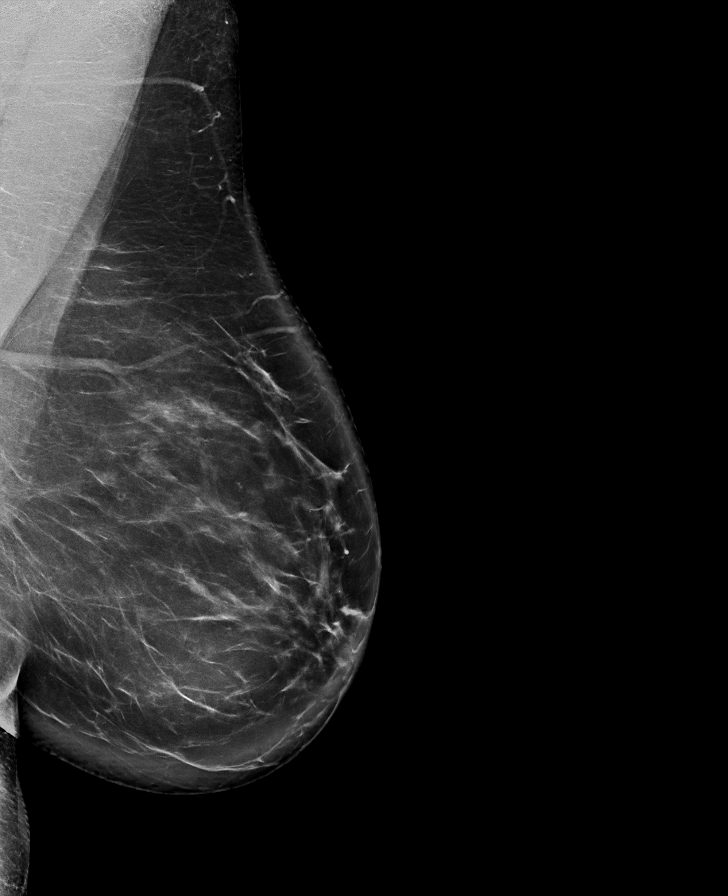

[R CC tomo · tomo slice 50/99.0]
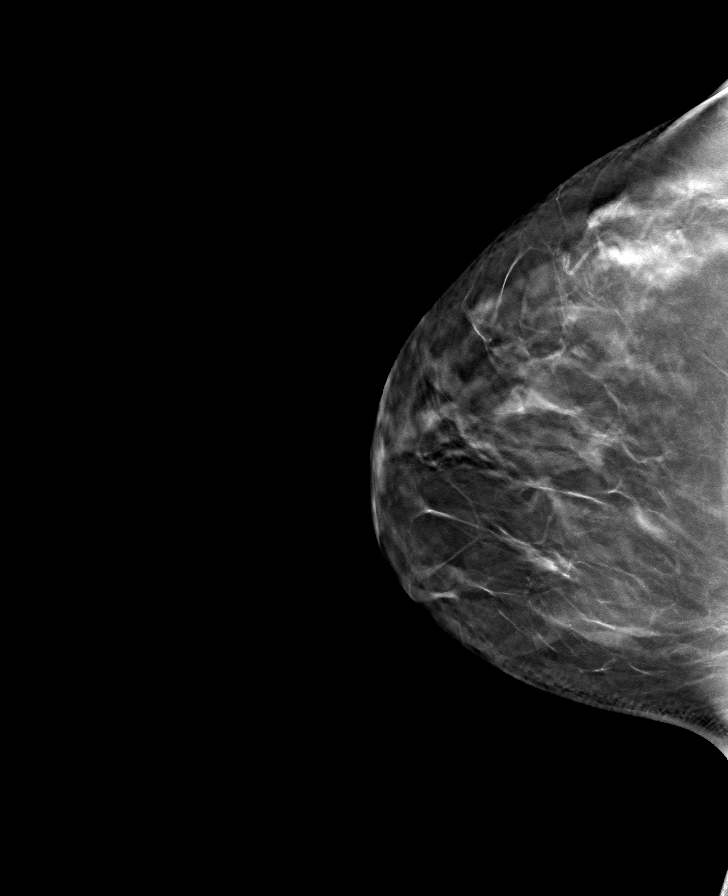

[R MLO tomo · tomo slice 51/102.0]
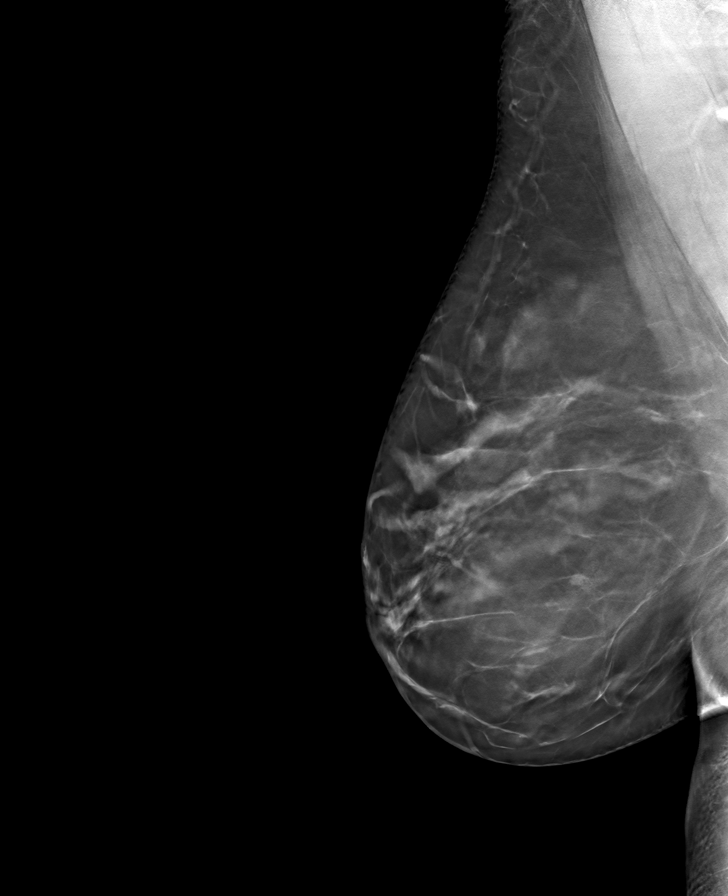

[L MLO tomo · tomo slice 54/107.0]
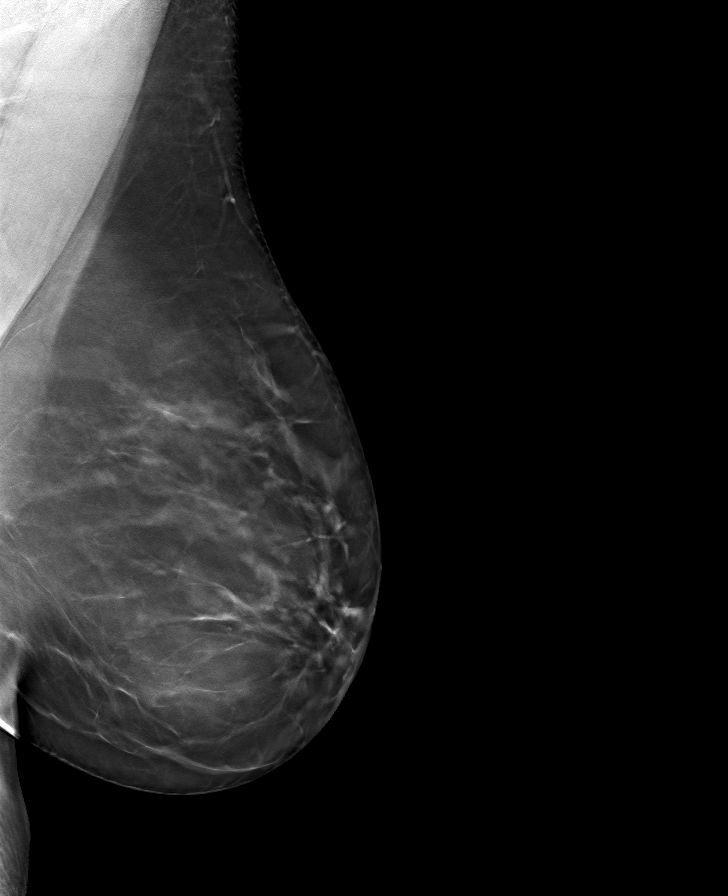

[L CC tomo · tomo slice 49/97.0]
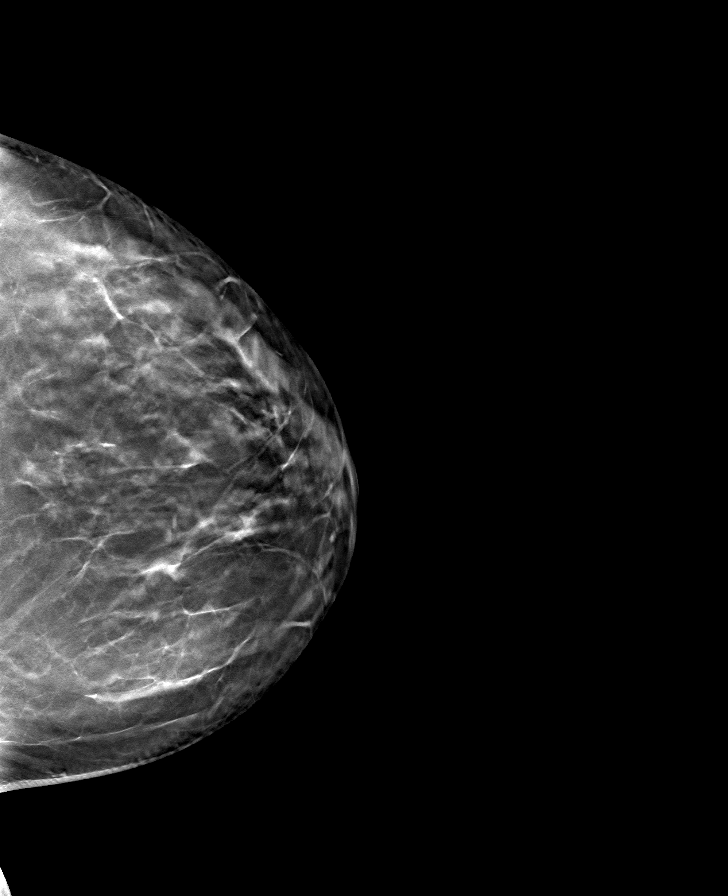

[8 of 24 positions shown; findings below may reference images not displayed]

ACR Breast Density Category b: There are scattered areas of
fibroglandular density.
FINDINGS: There are no findings suspicious for malignancy.
IMPRESSION: No mammographic evidence of malignancy. A result letter of this
screening mammogram will be mailed directly to the patient.

RECOMMENDATION:
Screening mammogram in one year. (Code:51-O-LD2)

BI-RADS CATEGORY  1: Negative.
